# Patient Record
Sex: Female | Born: 1945 | Race: White | Hispanic: No | Marital: Married | Smoking: Never smoker
Health system: Southern US, Community
[De-identification: ages and names within clinical notes are randomized; demographics above are authoritative.]

## PROBLEM LIST (undated history)

## (undated) DIAGNOSIS — E785 Hyperlipidemia, unspecified: Secondary | ICD-10-CM

## (undated) DIAGNOSIS — I10 Essential (primary) hypertension: Secondary | ICD-10-CM

## (undated) DIAGNOSIS — M1990 Unspecified osteoarthritis, unspecified site: Secondary | ICD-10-CM

## (undated) DIAGNOSIS — H409 Unspecified glaucoma: Secondary | ICD-10-CM

## (undated) DIAGNOSIS — E119 Type 2 diabetes mellitus without complications: Secondary | ICD-10-CM

## (undated) HISTORY — PX: BREAST CYST ASPIRATION: SHX578

## (undated) HISTORY — PX: CYST EXCISION: SHX5701

## (undated) HISTORY — PX: APPENDECTOMY: SHX54

## (undated) HISTORY — PX: BREAST BIOPSY: SHX20

## (undated) HISTORY — PX: COLONOSCOPY: SHX174

---

## 2004-07-22 ENCOUNTER — Ambulatory Visit: Payer: Self-pay | Admitting: Internal Medicine

## 2004-09-13 ENCOUNTER — Emergency Department: Payer: Self-pay | Admitting: Emergency Medicine

## 2005-08-05 ENCOUNTER — Ambulatory Visit: Payer: Self-pay | Admitting: Internal Medicine

## 2006-08-09 ENCOUNTER — Ambulatory Visit: Payer: Self-pay | Admitting: Internal Medicine

## 2006-12-11 ENCOUNTER — Ambulatory Visit: Payer: Self-pay | Admitting: Family Medicine

## 2007-07-20 ENCOUNTER — Other Ambulatory Visit: Payer: Self-pay

## 2007-07-20 ENCOUNTER — Ambulatory Visit: Payer: Self-pay | Admitting: General Surgery

## 2007-07-28 ENCOUNTER — Ambulatory Visit: Payer: Self-pay | Admitting: General Surgery

## 2007-10-03 ENCOUNTER — Ambulatory Visit: Payer: Self-pay | Admitting: General Surgery

## 2008-01-24 ENCOUNTER — Ambulatory Visit: Payer: Self-pay | Admitting: Unknown Physician Specialty

## 2008-04-10 ENCOUNTER — Emergency Department: Payer: Self-pay | Admitting: Emergency Medicine

## 2008-10-05 ENCOUNTER — Ambulatory Visit: Payer: Self-pay | Admitting: General Surgery

## 2009-10-16 ENCOUNTER — Ambulatory Visit: Payer: Self-pay | Admitting: Internal Medicine

## 2010-10-21 ENCOUNTER — Ambulatory Visit: Payer: Self-pay | Admitting: Internal Medicine

## 2011-04-03 ENCOUNTER — Ambulatory Visit: Payer: Self-pay | Admitting: Unknown Physician Specialty

## 2011-04-07 LAB — PATHOLOGY REPORT

## 2011-11-09 ENCOUNTER — Ambulatory Visit: Payer: Self-pay | Admitting: Internal Medicine

## 2012-11-14 ENCOUNTER — Ambulatory Visit: Payer: Self-pay | Admitting: Family Medicine

## 2013-11-24 ENCOUNTER — Ambulatory Visit: Payer: Self-pay | Admitting: Family Medicine

## 2014-12-10 ENCOUNTER — Other Ambulatory Visit: Payer: Self-pay | Admitting: Family Medicine

## 2014-12-10 DIAGNOSIS — Z1231 Encounter for screening mammogram for malignant neoplasm of breast: Secondary | ICD-10-CM

## 2014-12-19 ENCOUNTER — Ambulatory Visit
Admission: RE | Admit: 2014-12-19 | Discharge: 2014-12-19 | Disposition: A | Discharge status: Home / Self Care | Payer: 59 | Source: Ambulatory Visit | Attending: Family Medicine | Admitting: Family Medicine

## 2014-12-19 DIAGNOSIS — Z1231 Encounter for screening mammogram for malignant neoplasm of breast: Secondary | ICD-10-CM | POA: Insufficient documentation

## 2015-11-06 ENCOUNTER — Other Ambulatory Visit: Payer: Self-pay | Admitting: Family Medicine

## 2015-11-06 DIAGNOSIS — Z1231 Encounter for screening mammogram for malignant neoplasm of breast: Secondary | ICD-10-CM

## 2015-12-25 ENCOUNTER — Ambulatory Visit
Admission: RE | Admit: 2015-12-25 | Discharge: 2015-12-25 | Disposition: A | Discharge status: Home / Self Care | Payer: 59 | Source: Ambulatory Visit | Attending: Family Medicine | Admitting: Family Medicine

## 2015-12-25 DIAGNOSIS — Z1231 Encounter for screening mammogram for malignant neoplasm of breast: Secondary | ICD-10-CM

## 2016-11-11 ENCOUNTER — Other Ambulatory Visit: Payer: Self-pay | Admitting: Family Medicine

## 2016-11-11 DIAGNOSIS — Z1231 Encounter for screening mammogram for malignant neoplasm of breast: Secondary | ICD-10-CM

## 2016-12-28 ENCOUNTER — Ambulatory Visit
Admission: RE | Admit: 2016-12-28 | Discharge: 2016-12-28 | Disposition: A | Discharge status: Home / Self Care | Payer: 59 | Source: Ambulatory Visit | Attending: Family Medicine | Admitting: Family Medicine

## 2016-12-28 DIAGNOSIS — Z1231 Encounter for screening mammogram for malignant neoplasm of breast: Secondary | ICD-10-CM | POA: Diagnosis not present

## 2017-11-15 ENCOUNTER — Other Ambulatory Visit: Payer: Self-pay | Admitting: Family Medicine

## 2017-11-15 DIAGNOSIS — Z1231 Encounter for screening mammogram for malignant neoplasm of breast: Secondary | ICD-10-CM

## 2017-12-29 ENCOUNTER — Ambulatory Visit
Admission: RE | Admit: 2017-12-29 | Discharge: 2017-12-29 | Disposition: A | Discharge status: Home / Self Care | Payer: Managed Care, Other (non HMO) | Source: Ambulatory Visit | Attending: Family Medicine | Admitting: Family Medicine

## 2017-12-29 DIAGNOSIS — Z1231 Encounter for screening mammogram for malignant neoplasm of breast: Secondary | ICD-10-CM | POA: Insufficient documentation

## 2018-11-15 ENCOUNTER — Other Ambulatory Visit: Payer: Self-pay | Admitting: Family Medicine

## 2018-11-15 DIAGNOSIS — Z1231 Encounter for screening mammogram for malignant neoplasm of breast: Secondary | ICD-10-CM

## 2019-01-02 ENCOUNTER — Ambulatory Visit
Admission: RE | Admit: 2019-01-02 | Discharge: 2019-01-02 | Disposition: A | Discharge status: Home / Self Care | Payer: Managed Care, Other (non HMO) | Source: Ambulatory Visit | Attending: Family Medicine | Admitting: Family Medicine

## 2019-01-02 DIAGNOSIS — Z1231 Encounter for screening mammogram for malignant neoplasm of breast: Secondary | ICD-10-CM | POA: Diagnosis present

## 2019-11-08 ENCOUNTER — Other Ambulatory Visit: Payer: Self-pay | Admitting: Dermatology

## 2019-11-22 ENCOUNTER — Other Ambulatory Visit: Payer: Self-pay

## 2019-11-22 ENCOUNTER — Ambulatory Visit: Payer: Managed Care, Other (non HMO) | Admitting: Dermatology

## 2019-11-22 DIAGNOSIS — L409 Psoriasis, unspecified: Secondary | ICD-10-CM | POA: Diagnosis not present

## 2019-11-22 DIAGNOSIS — L4050 Arthropathic psoriasis, unspecified: Secondary | ICD-10-CM | POA: Diagnosis not present

## 2019-11-22 DIAGNOSIS — L821 Other seborrheic keratosis: Secondary | ICD-10-CM | POA: Diagnosis not present

## 2019-11-22 MED ORDER — TALTZ 80 MG/ML ~~LOC~~ SOAJ: 80.0000 mg | SUBCUTANEOUS | 6 refills | Status: AC

## 2019-11-22 NOTE — Progress Notes (Addendum)
   Follow-Up Visit   Subjective  Kellie Lyons is a 74 y.o. female who presents for the following: Psoriasis (6 months f/u on Psoriasis, treating with Donnetta Hail once a month with a good response, joints still flare at times  ) and Skin Problem (Check a lesion on the L flank under the bra ). Pt will be on Medicare in January 2022 she is concerned Medicare may not cover Taltz.   The following portions of the chart were reviewed this encounter and updated as appropriate:  Allergies  Meds  Problems  Med Hx  Surg Hx  Fam Hx     Review of Systems:  No other skin or systemic complaints except as noted in HPI or Assessment and Plan.  Objective  Well appearing patient in no apparent distress; mood and affect are within normal limits.  A focused examination was performed including face, exts, trunk . Relevant physical exam findings are noted in the Assessment and Plan.  Objective  trunk, exts: Elbows clear  R 4th finger PIP joint swelling,  Objective  L flank: Stuck-on, waxy, tan-brown papule or plaque --Discussed benign etiology and prognosis.    Assessment & Plan  Psoriasis - and Psoriatic Arthritis - severe - currently on systemic Taltz shots.  Skin mostly clear, but joints still symptomatic - flares on finger joints.  Not to goal.  Pt declines Rheumatology consult today. 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   Pt will have labs drawn again in December 2020, pt instructed to have labs sent here.   We will order lab =  TB Quantiferon Gold plus   Advised to review pharmacy benefits with new insurances for [place] coverage.  Other Related Procedures QuantiFERON-TB Gold Plus  Ordered Medications: Ixekizumab (TALTZ) 80 MG/ML  SOAJ  Seborrheic keratosis Trunk Benign-appearing.  Observation.  Call clinic for new or changing moles.  Recommend daily use of broad spectrum spf 30+ sunscreen to sun-exposed areas.    Return in about 6 months (around 05/21/2020) for Psoriasis .  IMarye Round, CMA, am acting as scribe for Sarina Ser, MD .  Documentation: I have reviewed the above documentation for accuracy and completeness, and I agree with the above.  Sarina Ser, MD

## 2019-11-23 ENCOUNTER — Encounter: Payer: Self-pay | Admitting: Dermatology

## 2019-11-24 ENCOUNTER — Encounter: Payer: Self-pay | Admitting: Dermatology

## 2019-12-12 ENCOUNTER — Other Ambulatory Visit: Payer: Self-pay | Admitting: Family Medicine

## 2019-12-12 DIAGNOSIS — Z1231 Encounter for screening mammogram for malignant neoplasm of breast: Secondary | ICD-10-CM

## 2020-01-08 ENCOUNTER — Ambulatory Visit
Admission: RE | Admit: 2020-01-08 | Discharge: 2020-01-08 | Disposition: A | Discharge status: Home / Self Care | Payer: Managed Care, Other (non HMO) | Source: Ambulatory Visit | Attending: Family Medicine | Admitting: Family Medicine

## 2020-01-08 ENCOUNTER — Other Ambulatory Visit: Payer: Self-pay

## 2020-01-08 DIAGNOSIS — Z1231 Encounter for screening mammogram for malignant neoplasm of breast: Secondary | ICD-10-CM | POA: Insufficient documentation

## 2020-02-02 LAB — QUANTIFERON-TB GOLD PLUS
QuantiFERON Mitogen Value: 9.13 IU/mL
QuantiFERON Nil Value: 0.08 IU/mL
QuantiFERON TB1 Ag Value: 0.09 IU/mL
QuantiFERON TB2 Ag Value: 0.09 IU/mL
QuantiFERON-TB Gold Plus: NEGATIVE

## 2020-02-05 ENCOUNTER — Telehealth: Payer: Self-pay

## 2020-02-05 NOTE — Telephone Encounter (Signed)
-----   Message from Ralene Bathe, MD sent at 02/05/2020  1:00 PM EST ----- 01/31/20 TB test = negative /normal Continue Taltz Keep follow up appt in March

## 2020-02-05 NOTE — Telephone Encounter (Signed)
Patient informed of lab results. 

## 2020-05-09 ENCOUNTER — Other Ambulatory Visit: Payer: Self-pay

## 2020-05-09 ENCOUNTER — Ambulatory Visit (INDEPENDENT_AMBULATORY_CARE_PROVIDER_SITE_OTHER): Payer: Medicare Other | Admitting: Dermatology

## 2020-05-09 DIAGNOSIS — D1801 Hemangioma of skin and subcutaneous tissue: Secondary | ICD-10-CM | POA: Diagnosis not present

## 2020-05-09 DIAGNOSIS — L4050 Arthropathic psoriasis, unspecified: Secondary | ICD-10-CM

## 2020-05-09 DIAGNOSIS — A498 Other bacterial infections of unspecified site: Secondary | ICD-10-CM

## 2020-05-09 DIAGNOSIS — L400 Psoriasis vulgaris: Secondary | ICD-10-CM

## 2020-05-09 MED ORDER — TOBRAMYCIN 0.3 % OP SOLN: OPHTHALMIC | 2 refills | Status: AC

## 2020-05-09 NOTE — Progress Notes (Signed)
   Follow-Up Visit   Subjective  Kellie Lyons is a 75 y.o. female who presents for the following: Psoriasis (6 months f/u psoriasis treating with Donnetta Hail once a month with a good response, pt recently retired and [employer], her medicare will will require her to come into the office once a month to get her Taltz injection). Pt c/o discolored toenails she would like to know if its related to her Psoriasis   The following portions of the chart were reviewed this encounter and updated as appropriate:   Allergies  Meds  Problems  Med Hx  Surg Hx  Fam Hx     Review of Systems:  No other skin or systemic complaints except as noted in HPI or Assessment and Plan.  Objective  Well appearing patient in no apparent distress; mood and affect are within normal limits.  A focused examination was performed including face, toenails, arms . Relevant physical exam findings are noted in the Assessment and Plan.  Objective  exts, trunk: Skin mainly clear   Objective  Left arm: Red papules.   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  Skin mostly clear, but joints still symptomatic - flares on finger joints.  Not to goal. 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  Observation.  Call clinic for new or changing moles.  Recommend daily use of broad spectrum spf 30+ sunscreen to sun-exposed areas.    Pseudomonas infection Right great toe, R 4th toe, R 5th toe,  L great toe Start Tobramycin 0.3% drops apply to nails once a day  Start  one tablespoon of bleach with a gallon of warm water in a basin or foot bath soaks once a week   Ordered Medications: tobramycin (TOBREX) 0.3 % ophthalmic solution  Return in about 4 months (around 09/08/2020) for Psoriasis .  IMarye Round, CMA, am acting as scribe for Sarina Ser, MD .  Documentation: I have reviewed the above documentation for accuracy and completeness, and I agree with the above.  Sarina Ser, MD

## 2020-05-09 NOTE — Progress Notes (Deleted)
   Follow-Up Visit   Subjective  Kellie Lyons is a 75 y.o. female who presents for the following: Psoriasis (6 months f/u psoriasis treating with Donnetta Hail once a month, ).   The following portions of the chart were reviewed this encounter and updated as appropriate:       Review of Systems:  No other skin or systemic complaints except as noted in HPI or Assessment and Plan.  Objective  Well appearing patient in no apparent distress; mood and affect are within normal limits.  {[id number]::"A full examination was performed including scalp, head, eyes, ears, nose, lips, neck, chest, axillae, abdomen, back, buttocks, bilateral upper extremities, bilateral lower extremities, hands, feet, fingers, toes, fingernails, and toenails. All findings within normal limits unless otherwise noted below."}    Assessment & Plan   No follow-ups on file.

## 2020-05-20 ENCOUNTER — Encounter: Payer: Self-pay | Admitting: Dermatology

## 2020-09-23 ENCOUNTER — Ambulatory Visit: Payer: Medicare Other | Admitting: Dermatology

## 2020-12-06 ENCOUNTER — Other Ambulatory Visit: Payer: Self-pay | Admitting: Family Medicine

## 2020-12-06 DIAGNOSIS — Z1231 Encounter for screening mammogram for malignant neoplasm of breast: Secondary | ICD-10-CM

## 2021-01-08 ENCOUNTER — Ambulatory Visit
Admission: RE | Admit: 2021-01-08 | Discharge: 2021-01-08 | Disposition: A | Discharge status: Home / Self Care | Payer: Medicare Other | Source: Ambulatory Visit | Attending: Family Medicine | Admitting: Family Medicine

## 2021-01-08 ENCOUNTER — Other Ambulatory Visit: Payer: Self-pay

## 2021-01-08 DIAGNOSIS — Z1231 Encounter for screening mammogram for malignant neoplasm of breast: Secondary | ICD-10-CM | POA: Insufficient documentation

## 2021-04-03 ENCOUNTER — Ambulatory Visit: Payer: Medicare Other | Admitting: Dermatology

## 2021-10-22 ENCOUNTER — Ambulatory Visit
Admission: RE | Admit: 2021-10-22 | Discharge: 2021-10-22 | Disposition: A | Discharge status: Home / Self Care | Payer: Medicare Other | Attending: Internal Medicine | Admitting: Internal Medicine

## 2021-10-22 ENCOUNTER — Ambulatory Visit: Payer: Medicare Other | Admitting: Anesthesiology

## 2021-10-22 ENCOUNTER — Other Ambulatory Visit: Payer: Self-pay

## 2021-10-22 ENCOUNTER — Encounter
Admission: RE | Disposition: A | Discharge status: Home / Self Care | Payer: Self-pay | Source: Home / Self Care | Attending: Internal Medicine

## 2021-10-22 ENCOUNTER — Encounter: Payer: Self-pay | Admitting: Internal Medicine

## 2021-10-22 DIAGNOSIS — Z1211 Encounter for screening for malignant neoplasm of colon: Secondary | ICD-10-CM | POA: Diagnosis present

## 2021-10-22 DIAGNOSIS — E785 Hyperlipidemia, unspecified: Secondary | ICD-10-CM | POA: Diagnosis not present

## 2021-10-22 DIAGNOSIS — L4050 Arthropathic psoriasis, unspecified: Secondary | ICD-10-CM | POA: Insufficient documentation

## 2021-10-22 DIAGNOSIS — Z86010 Personal history of colonic polyps: Secondary | ICD-10-CM | POA: Insufficient documentation

## 2021-10-22 DIAGNOSIS — K640 First degree hemorrhoids: Secondary | ICD-10-CM | POA: Insufficient documentation

## 2021-10-22 DIAGNOSIS — Z7969 Long term (current) use of other immunomodulators and immunosuppressants: Secondary | ICD-10-CM | POA: Insufficient documentation

## 2021-10-22 HISTORY — DX: Type 2 diabetes mellitus without complications: E11.9

## 2021-10-22 HISTORY — DX: Essential (primary) hypertension: I10

## 2021-10-22 HISTORY — DX: Unspecified osteoarthritis, unspecified site: M19.90

## 2021-10-22 HISTORY — DX: Unspecified glaucoma: H40.9

## 2021-10-22 HISTORY — DX: Hyperlipidemia, unspecified: E78.5

## 2021-10-22 HISTORY — PX: COLONOSCOPY: SHX5424

## 2021-10-22 LAB — GLUCOSE, CAPILLARY: Glucose-Capillary: 111 mg/dL — ABNORMAL HIGH (ref 70–99)

## 2021-10-22 SURGERY — COLONOSCOPY
Anesthesia: General

## 2021-10-22 MED ORDER — SODIUM CHLORIDE 0.9 % IV SOLN: INTRAVENOUS | Status: DC | PRN

## 2021-10-22 MED ORDER — PROPOFOL 500 MG/50ML IV EMUL
INTRAVENOUS | Status: DC | PRN
  Administered 2021-10-22: 200 ug/kg/min via INTRAVENOUS

## 2021-10-22 MED ORDER — SODIUM CHLORIDE 0.9 % IV SOLN: INTRAVENOUS | Status: DC

## 2021-10-22 MED ORDER — PROPOFOL 10 MG/ML IV BOLUS
INTRAVENOUS | Status: DC | PRN
  Administered 2021-10-22: 60 mg via INTRAVENOUS

## 2021-10-22 NOTE — Transfer of Care (Signed)
Immediate Anesthesia Transfer of Care Note  Patient: Kellie Lyons  Procedure(s) Performed: COLONOSCOPY  Patient Location: PACU  Anesthesia Type:General  Level of Consciousness: awake, alert  and oriented  Airway & Oxygen Therapy: Patient Spontanous Breathing and Patient connected to nasal cannula oxygen  Post-op Assessment: Report given to RN and Post -op Vital signs reviewed and stable  Post vital signs: Reviewed and stable  Last Vitals:  Vitals Value Taken Time  BP 107/78 10/22/21 1343  Temp    Pulse 87 10/22/21 1343  Resp 17 10/22/21 1343  SpO2 98 % 10/22/21 1343  Vitals shown include unvalidated device data.  Last Pain:  Vitals:   10/22/21 1245  TempSrc: Temporal  PainSc: 0-No pain         Complications: No notable events documented.

## 2021-10-22 NOTE — Anesthesia Postprocedure Evaluation (Signed)
Anesthesia Post Note  Patient: Kellie Lyons  Procedure(s) Performed: COLONOSCOPY  Patient location during evaluation: Endoscopy Anesthesia Type: General Level of consciousness: awake and alert Pain management: pain level controlled Vital Signs Assessment: post-procedure vital signs reviewed and stable Respiratory status: spontaneous breathing, nonlabored ventilation, respiratory function stable and patient connected to nasal cannula oxygen Cardiovascular status: blood pressure returned to baseline and stable Postop Assessment: no apparent nausea or vomiting Anesthetic complications: no   No notable events documented.   Last Vitals:  Vitals:   10/22/21 1245 10/22/21 1343  BP: 137/82 107/78  Pulse: 88   Resp: 18   Temp: (!) 36.2 C 36.9 C  SpO2: 99%     Last Pain:  Vitals:   10/22/21 1353  TempSrc:   PainSc: 0-No pain                 Arita Miss

## 2021-10-22 NOTE — Anesthesia Procedure Notes (Signed)
Date/Time: 10/22/2021 1:29 PM  Performed by: Nelda Marseille, CRNAPre-anesthesia Checklist: Patient identified, Emergency Drugs available, Suction available, Patient being monitored and Timeout performed Oxygen Delivery Method: Nasal cannula

## 2021-10-22 NOTE — Op Note (Signed)
[facility] Gastroenterology Patient Name: Kellie Lyons Procedure Date: 10/22/2021 1:22 PM MRN: [id number] Account #: [id number] Date of Birth: 03-Dec-1945 Admit Type: Outpatient Age: 76 Room: [facility] ENDO ROOM 2 Gender: Female Note Status: Finalized Instrument Name: Jasper Riling [id number] Procedure:             Colonoscopy Indications:           Surveillance: Personal history of adenomatous polyps                         on last colonoscopy > 5 years ago Providers:             Lorie Apley K. Alice Reichert MD, MD Referring MD:          Benay Pike. Tal Neer MD, MD (Referring MD) Medicines:             Propofol per Anesthesia Complications:         No immediate complications. Procedure:             Pre-Anesthesia Assessment:                        - The risks and benefits of the procedure and the                         sedation options and risks were discussed with the                         patient. All questions were answered and informed                         consent was obtained.                        - Patient identification and proposed procedure were                         verified prior to the procedure by the nurse. The                         procedure was verified in the procedure room.                        - ASA Grade Assessment: III - A patient with severe                         systemic disease.                        - After reviewing the risks and benefits, the patient                         was deemed in satisfactory condition to undergo the                         procedure.                        After obtaining informed consent, the colonoscope was  passed under direct vision. Throughout the procedure,                         the patient's blood pressure, pulse, and oxygen                         saturations were monitored continuously. The                         Colonoscope was introduced through the anus and                          advanced to the the cecum, identified by appendiceal                         orifice and ileocecal valve. The colonoscopy was                         performed without difficulty. The patient tolerated                         the procedure well. The quality of the bowel                         preparation was good. The ileocecal valve, appendiceal                         orifice, and rectum were photographed. Findings:      The perianal and digital rectal examinations were normal. Pertinent       negatives include normal sphincter tone and no palpable rectal lesions.      Non-bleeding internal hemorrhoids were found during retroflexion. The       hemorrhoids were Grade I (internal hemorrhoids that do not prolapse).      The colon (entire examined portion) appeared normal. Impression:            - Non-bleeding internal hemorrhoids.                        - The entire examined colon is normal.                        - No specimens collected. Recommendation:        - Patient has a contact number available for                         emergencies. The signs and symptoms of potential                         delayed complications were discussed with the patient.                         Return to normal activities tomorrow. Written                         discharge instructions were provided to the patient.                        - Resume previous diet.                        -  Continue present medications.                        - No repeat colonoscopy due to current age (58 years                         or older) and the absence of colonic polyps.                        - Return to GI office PRN.                        - The findings and recommendations were discussed with                         the patient. Procedure Code(s):     --- Professional ---                        F7510, Colorectal cancer screening; colonoscopy on                         individual at high risk Diagnosis  Code(s):     --- Professional ---                        K64.0, First degree hemorrhoids                        Z86.010, Personal history of colonic polyps CPT copyright 2019 American Medical Association. All rights reserved. The codes documented in this report are preliminary and upon coder review may  be revised to meet current compliance requirements. Efrain Sella MD, MD 10/22/2021 1:42:09 PM This report has been signed electronically. Number of Addenda: 0 Note Initiated On: 10/22/2021 1:22 PM Scope Withdrawal Time: 0 hours 5 minutes 44 seconds  Total Procedure Duration: 0 hours 9 minutes 26 seconds  Estimated Blood Loss:  Estimated blood loss: none.      [facility]

## 2021-10-22 NOTE — Anesthesia Preprocedure Evaluation (Signed)
Anesthesia Evaluation  Patient identified by MRN, date of birth, ID band Patient awake    Reviewed: Allergy & Precautions, NPO status , Patient's Chart, lab work & pertinent test results  History of Anesthesia Complications Negative for: history of anesthetic complications  Airway Mallampati: II  TM Distance: >3 FB Neck ROM: Limited    Dental no notable dental hx. (+) Teeth Intact   Pulmonary neg pulmonary ROS, neg sleep apnea, neg COPD, Patient abstained from smoking.Not current smoker,    Pulmonary exam normal breath sounds clear to auscultation       Cardiovascular Exercise Tolerance: Good METShypertension, Pt. on medications (-) CAD and (-) Past MI (-) dysrhythmias  Rhythm:Regular Rate:Normal - Systolic murmurs    Neuro/Psych negative neurological ROS  negative psych ROS   GI/Hepatic neg GERD  ,(+)     (-) substance abuse  ,   Endo/Other  diabetes, Well Controlled  Renal/GU negative Renal ROS     Musculoskeletal  (+) Arthritis , Rheumatoid disorders,  Psoriatic arthritis on remicaide infusions. Affects neck , stiffness, no shooting pains or numbness in arms.   Abdominal   Peds  Hematology   Anesthesia Other Findings Past Medical History: No date: Arthritis No date: Diabetes mellitus without complication ([place]) No date: Glaucoma No date: Hyperlipemia No date: Hypertension  Reproductive/Obstetrics                             Anesthesia Physical Anesthesia Plan  ASA: 2  Anesthesia Plan: General   Post-op Pain Management: Minimal or no pain anticipated   Induction: Intravenous  PONV Risk Score and Plan: 3 and Propofol infusion, TIVA and Ondansetron  Airway Management Planned: Nasal Cannula  Additional Equipment: None  Intra-op Plan:   Post-operative Plan:   Informed Consent: I have reviewed the patients History and Physical, chart, labs and discussed the procedure  including the risks, benefits and alternatives for the proposed anesthesia with the patient or authorized representative who has indicated his/her understanding and acceptance.     Dental advisory given  Plan Discussed with: CRNA and Surgeon  Anesthesia Plan Comments: (Discussed risks of anesthesia with patient, including possibility of difficulty with spontaneous ventilation under anesthesia necessitating airway intervention, PONV, and rare risks such as cardiac or respiratory or neurological events, and allergic reactions. Discussed the role of CRNA in patient's perioperative care. Patient understands.)        Anesthesia Quick Evaluation

## 2021-10-22 NOTE — Interval H&P Note (Signed)
History and Physical Interval Note:  10/22/2021 12:25 PM  Kellie Lyons  has presented today for surgery, with the diagnosis of Hx of adenomatous colonic polyps (Z86.010).  The various methods of treatment have been discussed with the patient and family. After consideration of risks, benefits and other options for treatment, the patient has consented to  Procedure(s): COLONOSCOPY (N/A) as a surgical intervention.  The patient's history has been reviewed, patient examined, no change in status, stable for surgery.  I have reviewed the patient's chart and labs.  Questions were answered to the patient's satisfaction.     [place], [place]

## 2021-10-22 NOTE — Interval H&P Note (Signed)
History and Physical Interval Note:  10/22/2021 12:26 PM  Kellie Lyons  has presented today for surgery, with the diagnosis of Hx of adenomatous colonic polyps (Z86.010).  The various methods of treatment have been discussed with the patient and family. After consideration of risks, benefits and other options for treatment, the patient has consented to  Procedure(s): COLONOSCOPY (N/A) as a surgical intervention.  The patient's history has been reviewed, patient examined, no change in status, stable for surgery.  I have reviewed the patient's chart and labs.  Questions were answered to the patient's satisfaction.     [place], [place]

## 2021-10-22 NOTE — H&P (Signed)
  Outpatient short stay form Pre-procedure 10/22/2021 12:24 PM Kellie Lyons, M.D.  Primary Physician: Kellie Lyons, M.D.  Reason for visit:  personal history of adenomatous colon polyps  History of present illness:  Kellie Lyons is a 76 year old female who comes in for follow-up colonoscopy for personal history of colon polyps. She has a history of psoriatic arthritis, treated with infliximab. She has a history of hyperlipidemia, elevated blood sugar. Her bowel habits have always been irregular ranging from once a day to once a week. On average, she has a formed stool every 2 to 3 days. She would like to have a good bowel movement once a day. She does have occasional cramping when her stomach feels full. Relieved with bowel movement. She had a really good bowel movement on Sunday, today is Wednesday and she has not had a bowel movement yet. She has noted no hematochezia, unintentional weight loss, generalized abdominal pain, nausea or vomiting. She has not tried to increase the fiber in her diet. She reports that she does not drink enough water. She denies any upper GI complaints.  Hx of adenomatous colonic polyps (primary encounter diagnosis) Slow transit constipation  - 04/03/2011: Colonoscopy for personal history of colon polyps: Imp: 3 diminutive polyps, IH. Pathology: sessile serrated adenoma, tubular adenoma, sessile serrated polyp possible hyperplastic but cannot rule out sessile serrated adenoma.  No current facility-administered medications for this encounter.  Medications Prior to Admission  Medication Sig Dispense Refill Last Dose   Ixekizumab (TALTZ) 80 MG/ML SOAJ Inject 80 mg into the skin every 28 (twenty-eight) days. For maintenance. 1 mL 6    tobramycin (TOBREX) 0.3 % ophthalmic solution Apply 1 drop daily to each toenail 5 mL 2      Not on File   No past medical history on file.  Review of systems:  Otherwise negative.    Physical Exam  Gen: Alert, oriented.  Appears stated age.  HEENT: [place]/AT. PERRLA. Lungs: CTA, no wheezes. CV: RR nl S1, S2. Abd: soft, benign, no masses. BS+ Ext: No edema. Pulses 2+    Planned procedures: Proceed with colonoscopy. The patient understands the nature of the planned procedure, indications, risks, alternatives and potential complications including but not limited to bleeding, infection, perforation, damage to internal organs and possible oversedation/side effects from anesthesia. The patient agrees and gives consent to proceed.  Please refer to procedure notes for findings, recommendations and patient disposition/instructions.     Kellie Lyons, M.D. Gastroenterology 10/22/2021  12:24 PM

## 2021-10-23 ENCOUNTER — Encounter: Payer: Self-pay | Admitting: Internal Medicine

## 2021-12-19 ENCOUNTER — Other Ambulatory Visit: Payer: Self-pay | Admitting: Family Medicine

## 2021-12-19 DIAGNOSIS — Z1231 Encounter for screening mammogram for malignant neoplasm of breast: Secondary | ICD-10-CM

## 2022-03-24 ENCOUNTER — Ambulatory Visit
Admission: RE | Admit: 2022-03-24 | Discharge: 2022-03-24 | Disposition: A | Discharge status: Home / Self Care | Payer: Medicare Other | Source: Ambulatory Visit | Attending: Family Medicine | Admitting: Family Medicine

## 2022-03-24 DIAGNOSIS — Z1231 Encounter for screening mammogram for malignant neoplasm of breast: Secondary | ICD-10-CM | POA: Insufficient documentation

## 2023-03-08 ENCOUNTER — Other Ambulatory Visit: Payer: Self-pay | Admitting: Family Medicine

## 2023-03-08 DIAGNOSIS — Z1231 Encounter for screening mammogram for malignant neoplasm of breast: Secondary | ICD-10-CM

## 2023-03-26 ENCOUNTER — Ambulatory Visit
Admission: RE | Admit: 2023-03-26 | Discharge: 2023-03-26 | Disposition: A | Discharge status: Home / Self Care | Payer: Medicare Other | Source: Ambulatory Visit | Attending: Family Medicine | Admitting: Family Medicine

## 2023-03-26 DIAGNOSIS — Z1231 Encounter for screening mammogram for malignant neoplasm of breast: Secondary | ICD-10-CM | POA: Insufficient documentation

## 2023-09-16 IMAGING — MG MM DIGITAL SCREENING BILAT W/ TOMO AND CAD
6 of 10 series · 6 of 30 positions shown · non-contrast
Comparison: Previous exam(s).

CLINICAL DATA: Screening.

EXAM:
DIGITAL SCREENING BILATERAL MAMMOGRAM WITH TOMOSYNTHESIS AND CAD
TECHNIQUE: Bilateral screening digital craniocaudal and mediolateral oblique
mammograms were obtained. Bilateral screening digital breast
tomosynthesis was performed. The images were evaluated with
computer-aided detection.

[R MLO synth-2D (1 of 2)]
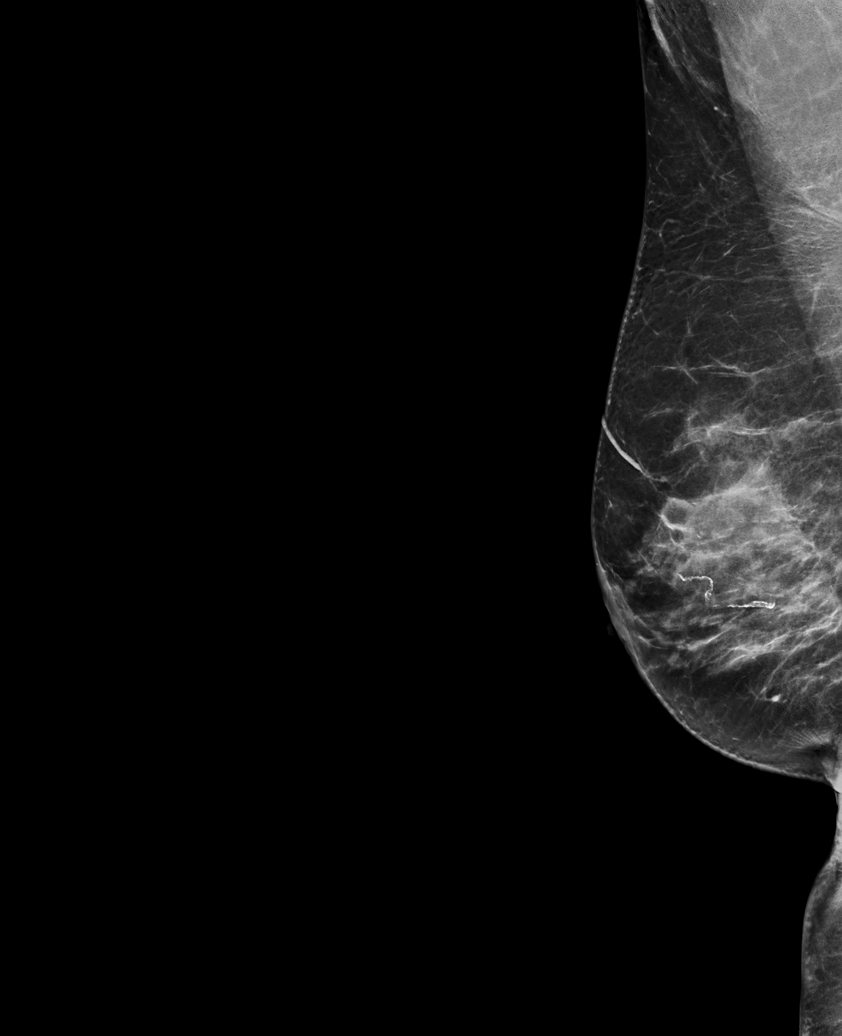

[R CC synth-2D]
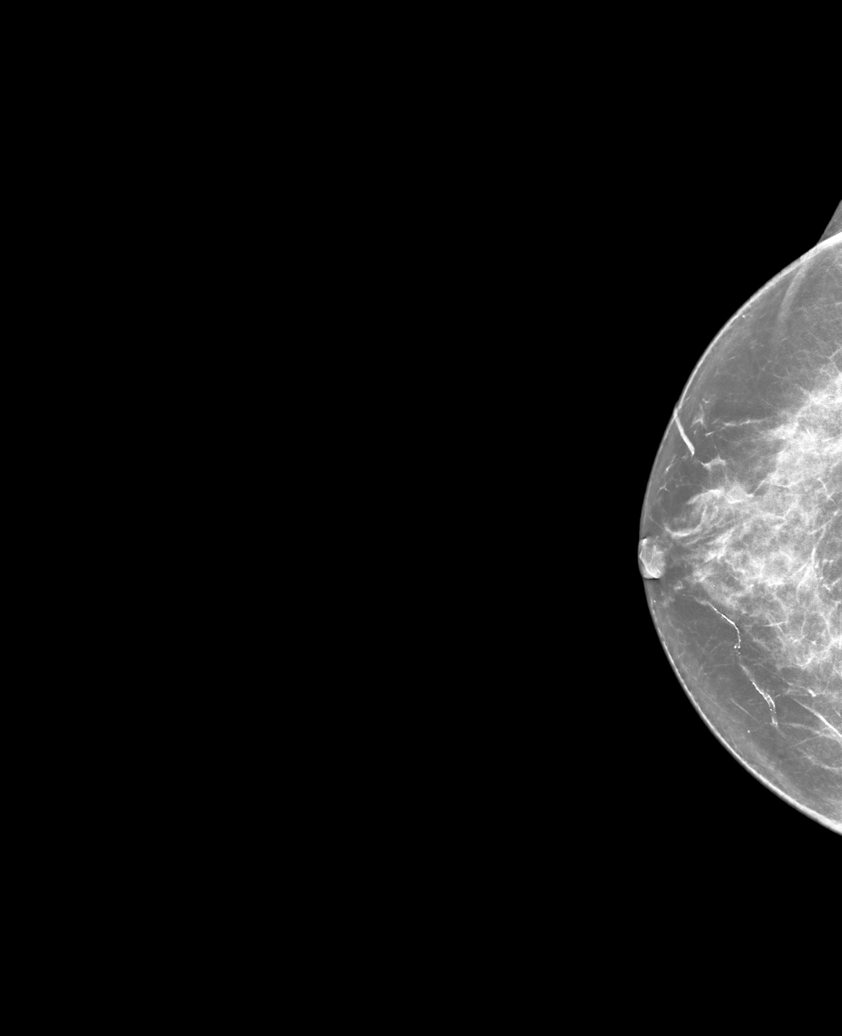

[L CC synth-2D]
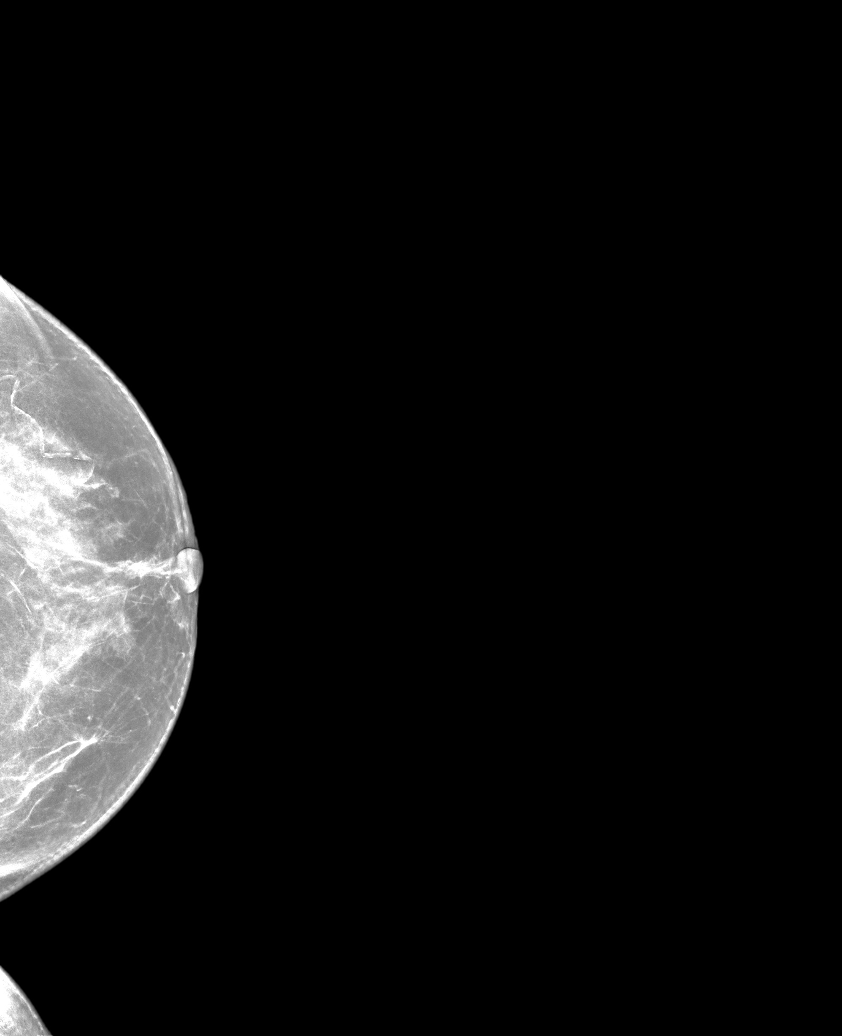

[L MLO synth-2D]
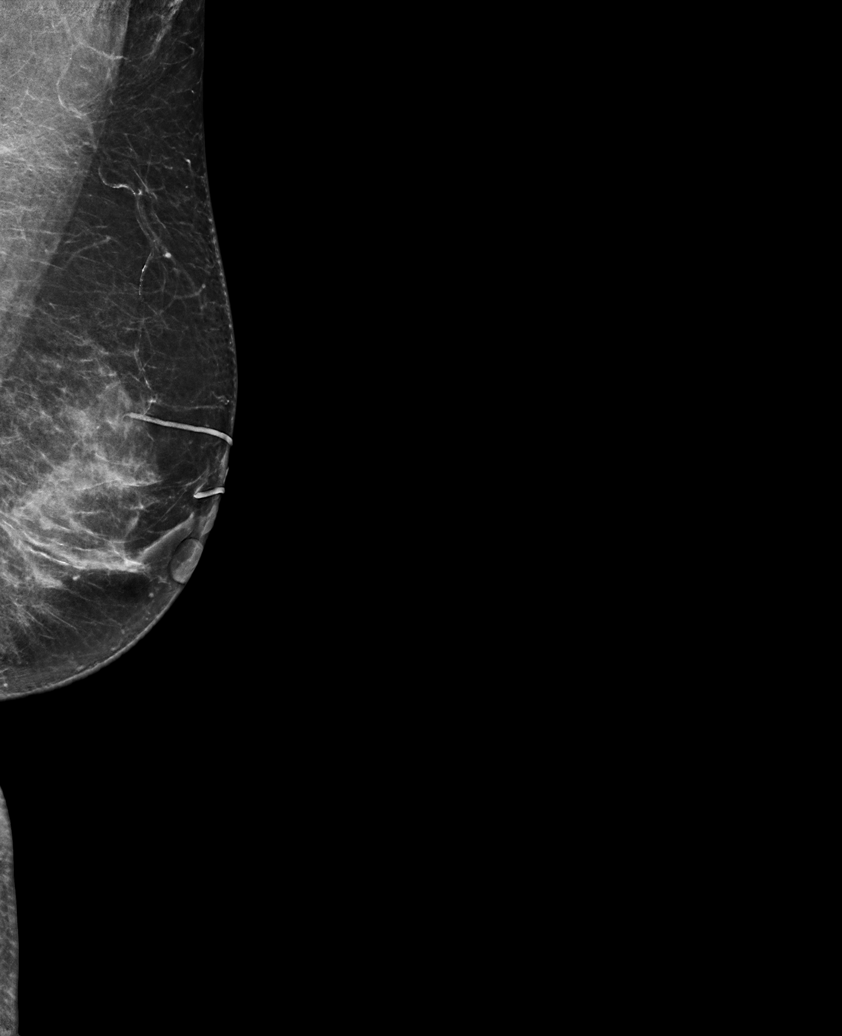

[R MLO synth-2D (2 of 2)]
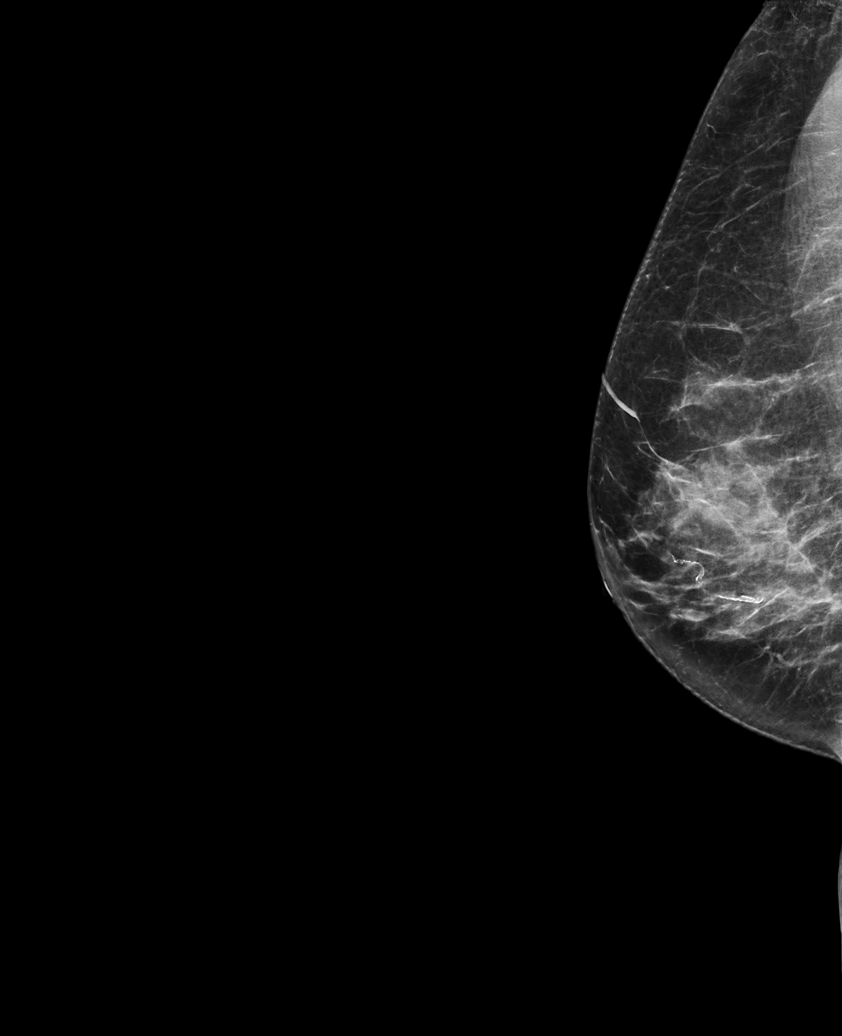

[R MLO tomo · tomo slice 31/62.0]
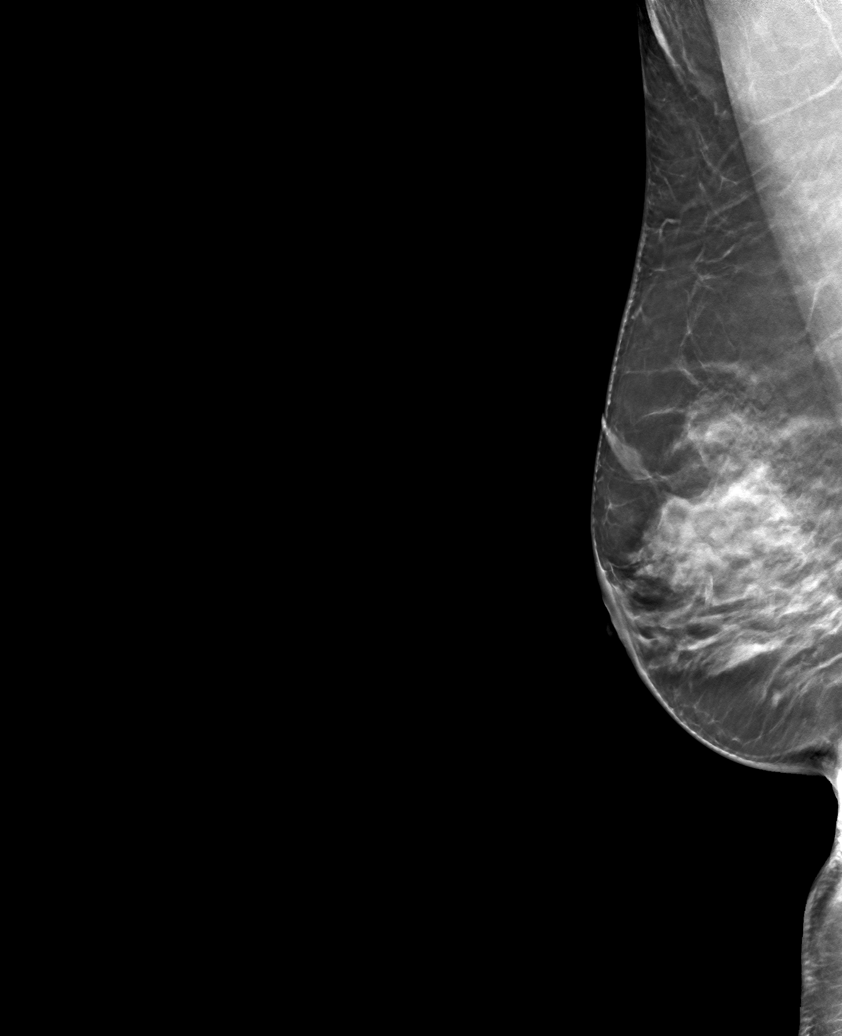

[6 of 30 positions shown; findings below may reference images not displayed]

ACR Breast Density Category c: The breast tissue is heterogeneously
dense, which may obscure small masses.
FINDINGS: There are no findings suspicious for malignancy.
IMPRESSION: No mammographic evidence of malignancy. A result letter of this
screening mammogram will be mailed directly to the patient.

RECOMMENDATION:
Screening mammogram in one year. (Code:Q3-W-BC3)

BI-RADS CATEGORY  1: Negative.

## 2024-03-13 ENCOUNTER — Other Ambulatory Visit: Payer: Self-pay | Admitting: Family Medicine

## 2024-03-13 DIAGNOSIS — Z1231 Encounter for screening mammogram for malignant neoplasm of breast: Secondary | ICD-10-CM

## 2024-03-30 ENCOUNTER — Ambulatory Visit
Admission: RE | Admit: 2024-03-30 | Discharge: 2024-03-30 | Disposition: A | Discharge status: Home / Self Care | Source: Ambulatory Visit | Attending: Family Medicine | Admitting: Family Medicine

## 2024-03-30 DIAGNOSIS — Z1231 Encounter for screening mammogram for malignant neoplasm of breast: Secondary | ICD-10-CM | POA: Insufficient documentation
